# Patient Record
Sex: Male | Born: 2005 | Race: White | Hispanic: No | Marital: Single | Smoking: Never smoker
Health system: Southern US, Community
[De-identification: ages and names within clinical notes are randomized; demographics above are authoritative.]

## PROBLEM LIST (undated history)

## (undated) DIAGNOSIS — F900 Attention-deficit hyperactivity disorder, predominantly inattentive type: Secondary | ICD-10-CM

## (undated) DIAGNOSIS — Z8619 Personal history of other infectious and parasitic diseases: Secondary | ICD-10-CM

## (undated) DIAGNOSIS — F411 Generalized anxiety disorder: Secondary | ICD-10-CM

## (undated) HISTORY — DX: Personal history of other infectious and parasitic diseases: Z86.19

## (undated) HISTORY — DX: Generalized anxiety disorder: F41.1

## (undated) HISTORY — PX: NO PAST SURGERIES: SHX2092

## (undated) HISTORY — DX: Attention-deficit hyperactivity disorder, predominantly inattentive type: F90.0

---
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
## 2023-02-07 ENCOUNTER — Other Ambulatory Visit: Payer: Self-pay | Admitting: Pediatrics

## 2023-02-07 DIAGNOSIS — R1030 Lower abdominal pain, unspecified: Secondary | ICD-10-CM

## 2023-02-15 ENCOUNTER — Ambulatory Visit: Admission: RE | Admit: 2023-02-15 | Payer: 59 | Source: Ambulatory Visit

## 2023-04-06 ENCOUNTER — Encounter: Payer: Self-pay | Admitting: Urology

## 2023-04-06 ENCOUNTER — Ambulatory Visit (INDEPENDENT_AMBULATORY_CARE_PROVIDER_SITE_OTHER): Payer: 59 | Admitting: Urology

## 2023-04-06 VITALS — BP 145/95 | HR 125 | Ht 73.0 in | Wt 262.0 lb

## 2023-04-06 DIAGNOSIS — N5082 Scrotal pain: Secondary | ICD-10-CM

## 2023-04-06 DIAGNOSIS — M545 Low back pain, unspecified: Secondary | ICD-10-CM

## 2023-04-06 DIAGNOSIS — Z87438 Personal history of other diseases of male genital organs: Secondary | ICD-10-CM | POA: Diagnosis not present

## 2023-04-06 LAB — URINALYSIS, COMPLETE
Bilirubin, UA: NEGATIVE
Glucose, UA: NEGATIVE
Ketones, UA: NEGATIVE
Leukocytes,UA: NEGATIVE
Nitrite, UA: NEGATIVE
Protein,UA: NEGATIVE
RBC, UA: NEGATIVE
Specific Gravity, UA: 1.02 (ref 1.005–1.030)
Urobilinogen, Ur: 0.2 mg/dL (ref 0.2–1.0)
pH, UA: 6 (ref 5.0–7.5)

## 2023-04-06 LAB — MICROSCOPIC EXAMINATION

## 2023-04-06 NOTE — Progress Notes (Signed)
Marcelle Overlie Plume,acting as a scribe for Riki Altes, MD.,have documented all relevant documentation on the behalf of Riki Altes, MD,as directed by  Riki Altes, MD while in the presence of Riki Altes, MD.  04/06/2023 2:08 PM   Wayne Campbell 04-03-2006 [id number]  Referring provider: Jackelyn Poling, MD [address] [place],  [place] [address]  Chief Complaint  Patient presents with   New Patient (Initial Visit)    HPI: Wayne Campbell is a 17 y.o. male who is referred for evaluation of  right scrotal pain.  Seen at [facility] on 02/04/2023 with a 1 week history of dull right scrotal pain rated at 5/10. Symptoms exacerbated with walking He was treated empirically with doxycycline for epididymitis A scrotal ultrasound was ordered but not scheduled. He states his pain resolved after antibiotic therapy and did not get the ultrasound performed. His primary complaint has been lower back pain No bothersome LUTS He kept his appointment since he has been treated for epididymitis x2 over the last 2 years.  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 Homero Fellers psychologist)   GAD (generalized anxiety disorder)    History of shingles    due to varicella vaccine    Surgical History: Past Surgical History:  Procedure Laterality Date   NO PAST SURGERIES      Home Medications:  Allergies as of 04/06/2023   No Known Allergies      Medication List        Accurate as of April 06, 2023  2:08 PM. If you have any questions, ask your nurse or doctor.          STOP taking these medications    ondansetron 4 MG disintegrating tablet Commonly known as: Zofran ODT Stopped by: Riki Altes, MD       Family History: Family History  Problem Relation Age of Onset   Diabetes Paternal Uncle        type 1   Diabetes Paternal Grandfather        type 1    Arthritis Mother        OA, RA   Aneurysm Paternal Uncle    CAD Neg Hx    Stroke Neg Hx    Cancer Neg Hx     Social History:  reports that he has never smoked. He has never used smokeless tobacco. He reports that he does not drink alcohol and does not use drugs.   Physical Exam: BP (!) 145/95   Pulse (!) 125   Ht 6\' 1"  (1.854 m)   Wt 262 lb (118.8 kg)   BMI 34.57 kg/m   Constitutional:  Alert and oriented, No acute distress. HEENT: [place] AT, moist mucus membranes.  Trachea midline, no masses. Cardiovascular: No clubbing, cyanosis, or edema. Respiratory: Normal respiratory effort, no increased work of breathing. GI: Abdomen is soft, nontender, nondistended, no abdominal masses GU: Phallus without lesions. Testes descended bilaterally without masses or tenderness, spermatic cords/epididymis palpably normal bilaterally Skin: No rashes, bruises or suspicious lesions. Neurologic: Grossly intact, no focal deficits, moving all 4 extremities. Psychiatric: Normal mood and affect.  Laboratory Data:  Urinalysis Dipstick/ microscopy: Negative  Assessment & Plan:    1. History of scrotal pain Presently asymptomatic He was inquiring about possible reasons for recurrent epididymitis We discussed the diagnosis scrotal is often diagnosed as epididymitis and since he is presently asymptomatic  recommend a follow up visit for re-exam while symptomatic  2. Low back pain Most likely musculoskeletal in etiology and recommended PCP follow up for further evaluation  I have reviewed the above documentation for accuracy and completeness, and I agree with the above.   Riki Altes, MD  [facility] [address] [place], [place] [address] [phone]

## 2024-05-22 ENCOUNTER — Ambulatory Visit (INDEPENDENT_AMBULATORY_CARE_PROVIDER_SITE_OTHER): Admitting: Nurse Practitioner

## 2024-05-22 ENCOUNTER — Encounter: Payer: Self-pay | Admitting: Nurse Practitioner

## 2024-05-22 VITALS — BP 128/88 | HR 105 | Temp 98.2°F | Ht 74.85 in | Wt 276.4 lb

## 2024-05-22 DIAGNOSIS — Z13220 Encounter for screening for lipoid disorders: Secondary | ICD-10-CM

## 2024-05-22 DIAGNOSIS — Z114 Encounter for screening for human immunodeficiency virus [HIV]: Secondary | ICD-10-CM | POA: Diagnosis not present

## 2024-05-22 DIAGNOSIS — Z1159 Encounter for screening for other viral diseases: Secondary | ICD-10-CM | POA: Diagnosis not present

## 2024-05-22 DIAGNOSIS — Z6834 Body mass index (BMI) 34.0-34.9, adult: Secondary | ICD-10-CM | POA: Diagnosis not present

## 2024-05-22 DIAGNOSIS — F900 Attention-deficit hyperactivity disorder, predominantly inattentive type: Secondary | ICD-10-CM | POA: Diagnosis not present

## 2024-05-22 DIAGNOSIS — Z0000 Encounter for general adult medical examination without abnormal findings: Secondary | ICD-10-CM

## 2024-05-22 DIAGNOSIS — E669 Obesity, unspecified: Secondary | ICD-10-CM | POA: Diagnosis not present

## 2024-05-22 DIAGNOSIS — F320 Major depressive disorder, single episode, mild: Secondary | ICD-10-CM

## 2024-05-22 DIAGNOSIS — Z131 Encounter for screening for diabetes mellitus: Secondary | ICD-10-CM

## 2024-05-22 LAB — CBC WITH DIFFERENTIAL/PLATELET
Basophils Absolute: 0 K/uL (ref 0.0–0.1)
Basophils Relative: 0.6 % (ref 0.0–3.0)
Eosinophils Absolute: 0.2 K/uL (ref 0.0–0.7)
Eosinophils Relative: 2.9 % (ref 0.0–5.0)
HCT: 49.7 % (ref 39.0–52.0)
Hemoglobin: 16.8 g/dL (ref 13.0–17.0)
Lymphocytes Relative: 38.9 % (ref 12.0–46.0)
Lymphs Abs: 2.7 K/uL (ref 0.7–4.0)
MCHC: 33.7 g/dL (ref 30.0–36.0)
MCV: 89.1 fl (ref 78.0–100.0)
Monocytes Absolute: 0.7 K/uL (ref 0.1–1.0)
Monocytes Relative: 10.7 % (ref 3.0–12.0)
Neutro Abs: 3.3 K/uL (ref 1.4–7.7)
Neutrophils Relative %: 46.9 % (ref 43.0–77.0)
Platelets: 392 K/uL (ref 150.0–400.0)
RBC: 5.58 Mil/uL (ref 4.22–5.81)
RDW: 12.7 % (ref 11.5–14.6)
WBC: 6.9 K/uL (ref 4.5–10.5)

## 2024-05-22 LAB — COMPREHENSIVE METABOLIC PANEL WITH GFR
ALT: 72 U/L — ABNORMAL HIGH (ref 0–53)
AST: 29 U/L (ref 0–37)
Albumin: 4.9 g/dL (ref 3.5–5.2)
Alkaline Phosphatase: 76 U/L (ref 39–117)
BUN: 9 mg/dL (ref 6–23)
CO2: 29 meq/L (ref 19–32)
Calcium: 10 mg/dL (ref 8.4–10.5)
Chloride: 105 meq/L (ref 96–112)
Creatinine, Ser: 1.25 mg/dL (ref 0.40–1.50)
GFR: 82.89 mL/min (ref >60.00)
Glucose, Bld: 97 mg/dL (ref 70–99)
Potassium: 4.1 meq/L (ref 3.5–5.1)
Sodium: 142 meq/L (ref 135–145)
Total Bilirubin: 0.7 mg/dL (ref 0.2–1.2)
Total Protein: 7.6 g/dL (ref 6.0–8.3)

## 2024-05-22 LAB — LIPID PANEL
Cholesterol: 165 mg/dL (ref 0–200)
HDL: 37.5 mg/dL — ABNORMAL LOW (ref >39.00)
LDL Cholesterol: 97 mg/dL (ref 0–99)
NonHDL: 127.22
Total CHOL/HDL Ratio: 4
Triglycerides: 153 mg/dL — ABNORMAL HIGH (ref 0.0–149.0)
VLDL: 30.6 mg/dL (ref 0.0–40.0)

## 2024-05-22 LAB — TSH: TSH: 1.03 u[IU]/mL (ref 0.35–5.50)

## 2024-05-22 LAB — HEMOGLOBIN A1C: Hgb A1c MFr Bld: 5.4 % (ref 4.6–6.5)

## 2024-05-22 MED ORDER — SERTRALINE HCL 50 MG PO TABS: ORAL_TABLET | ORAL | 0 refills | Status: AC

## 2024-05-22 NOTE — Assessment & Plan Note (Signed)
 History of the same.  Never been on medication management.  Stable

## 2024-05-22 NOTE — Assessment & Plan Note (Signed)
 Pending TSH, A1c, lipid panel.  Work on healthy lifestyle modifications

## 2024-05-22 NOTE — Patient Instructions (Signed)
Nice to see you today I will be in touch with the labs once I have them Follow up with me in 6 weeks, sooner if you need me 

## 2024-05-22 NOTE — Assessment & Plan Note (Signed)
 No HI/SI/AVH.  Will start patient on sertraline  25 mg daily for 2 weeks then increase to 50 mg daily thereafter.  PHQ-9 and GAD-7 administered in office

## 2024-05-22 NOTE — Assessment & Plan Note (Signed)
 Discussed age-appropriate immunizations and screening exams.  Did review patient's personal, surgical, social, family histories.  Patient is up-to-date with all age-appropriate vaccinations he would like.  Patient declined COVID and HPV vaccines.  Patient is too young for CRC screening or prostate cancer screening.  Patient declined STI screen today.  Patient was given information at discharge about preventative healthcare maintenance with anticipatory guidance.

## 2024-05-22 NOTE — Progress Notes (Signed)
 New Patient Office Visit  Subjective    Patient ID: Wayne Campbell, male    DOB: Sep 25, 2006  Age: 18 y.o. MRN: [id number]  CC:  Chief Complaint  Patient presents with   Establish Care    General check up.     HPI Wayne Campbell presents to establish care   for complete physical and follow up of chronic conditions.  Seizures: states that in the 6th grade he came down with a fever. Thinks it was febrile seizure. States that he did hit his head against the tub.has been tested for epilipsy that was negative   ADD: hx dx is not currently medicated and has not been   GAD: states that he is not currenlty treated and feels ok. Denies any symptoms of anxiety.  Has not done therapy in the past. Immunizations: -Tetanus: Completed in 2017 -Influenza: out of season -Shingles: too young  -Pneumonia: too young  -covid: refused  -HPV: refused   Diet: Fair diet. He is eating 2 meals a day. He will skip breakfast. States that he does not snack often.  Soda  Exercise: No regular exercise.  Eye exam: PRN Dental exam: needs updating    Colonoscopy: Too young, currently average risk Lung Cancer Screening: N/A  PSA: Too young, currently average risk  Sleep: goes to bed around 10 and go sleep around 1 and then will get up around 10. Feels drowsy. Does not snore   STI: not currently sexually active     Outpatient Encounter Medications as of 05/22/2024  Medication Sig   sertraline  (ZOLOFT ) 50 MG tablet Take 0.5 tablets (25 mg total) by mouth daily for 14 days, THEN 1 tablet (50 mg total) daily for 16 days.   No facility-administered encounter medications on file as of 05/22/2024.    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 Elodia psychologist)   GAD (generalized anxiety disorder)    History of shingles    due to varicella vaccine    Past Surgical History:  Procedure Laterality Date   NO  PAST SURGERIES      Family History  Problem Relation Age of Onset   Diabetes Paternal Uncle        type 1   Diabetes Paternal Grandfather        type 1   Arthritis Mother        OA, RA   Aneurysm Paternal Uncle    CAD Neg Hx    Stroke Neg Hx    Cancer Neg Hx     Social History   Socioeconomic History   Marital status: Single    Spouse name: Not on file   Number of children: Not on file   Years of education: Not on file   Highest education level: Not on file  Occupational History   Not on file  Tobacco Use   Smoking status: Never   Smokeless tobacco: Never  Vaping Use   Vaping status: Former  Substance and Sexual Activity   Alcohol use: No   Drug use: No   Sexual activity: Not on file  Other Topics Concern   Not on file  Social History Narrative   He is debating on going back to school   He is not currently working       Live mom and dad. Has 2 brothers at home that are younger    Social Drivers of [employer]  Resource Strain: Not on file  Food Insecurity: Not on file  Transportation Needs: Not on file  Physical Activity: Not on file  Stress: Not on file  Social Connections: Not on file  Intimate Partner Violence: Not on file    Review of Systems  Constitutional:  Negative for chills and fever.  Respiratory:  Negative for shortness of breath.   Cardiovascular:  Negative for chest pain and leg swelling.  Gastrointestinal:  Negative for abdominal pain, blood in stool, constipation, diarrhea, nausea and vomiting.       BM daily to every other   Genitourinary:  Negative for dysuria and hematuria.  Neurological:  Negative for dizziness (quick position changes), tingling and headaches.  Psychiatric/Behavioral:  Negative for hallucinations and suicidal ideas.         Objective    BP 128/88   Pulse (!) 105   Temp 98.2 F (36.8 C) (Oral)   Ht 6' 2.85 (1.901 m)   Wt 276 lb 6.4 oz (125.4 kg)   SpO2 99%   BMI 34.69 kg/m   Physical  Exam Vitals and nursing note reviewed.  Constitutional:      Appearance: Normal appearance.  HENT:     Right Ear: Tympanic membrane, ear canal and external ear normal.     Left Ear: Tympanic membrane, ear canal and external ear normal.     Mouth/Throat:     Mouth: Mucous membranes are moist.     Pharynx: Oropharynx is clear.  Eyes:     Extraocular Movements: Extraocular movements intact.     Pupils: Pupils are equal, round, and reactive to light.  Cardiovascular:     Rate and Rhythm: Regular rhythm. Tachycardia present.     Pulses: Normal pulses.     Heart sounds: Normal heart sounds.  Pulmonary:     Effort: Pulmonary effort is normal.     Breath sounds: Normal breath sounds.  Abdominal:     General: Bowel sounds are normal. There is no distension.     Palpations: There is no mass.     Tenderness: There is no abdominal tenderness.     Hernia: No hernia is present.  Genitourinary:    Comments: deferred Musculoskeletal:     Right lower leg: No edema.     Left lower leg: No edema.  Lymphadenopathy:     Cervical: No cervical adenopathy.  Skin:    General: Skin is warm.  Neurological:     General: No focal deficit present.     Mental Status: He is alert.     Deep Tendon Reflexes:     Reflex Scores:      Bicep reflexes are 2+ on the right side and 2+ on the left side.      Patellar reflexes are 2+ on the right side and 2+ on the left side.    Comments: Bilateral upper and lower extremity strength 5/5  Psychiatric:        Mood and Affect: Mood normal.        Behavior: Behavior normal.        Thought Content: Thought content normal.        Judgment: Judgment normal.         Assessment & Plan:   Problem List Items Addressed This Visit       Other   Preventative health care - Primary   Discussed age-appropriate immunizations and screening exams.  Did review patient's personal, surgical, social, family histories.  Patient is up-to-date with all age-appropriate  vaccinations he would like.  Patient declined COVID and HPV vaccines.  Patient is too young for CRC screening or prostate cancer screening.  Patient declined STI screen today.  Patient was given information at discharge about preventative healthcare maintenance with anticipatory guidance.      Relevant Orders   CBC with Differential/Platelet   Comprehensive metabolic panel with GFR   Attention deficit hyperactivity disorder (ADHD), predominantly inattentive type   History of the same.  Never been on medication management.  Stable      Relevant Orders   TSH   Depression, major, single episode, mild (HCC)   No HI/SI/AVH.  Will start patient on sertraline  25 mg daily for 2 weeks then increase to 50 mg daily thereafter.  PHQ-9 and GAD-7 administered in office      Relevant Medications   sertraline  (ZOLOFT ) 50 MG tablet   Other Relevant Orders   TSH   Obesity (BMI 30-39.9)   Pending TSH, A1c, lipid panel.  Work on healthy lifestyle modifications      Relevant Orders   Hemoglobin A1c   Lipid panel   TSH   Other Visit Diagnoses       Encounter for hepatitis C screening test for low risk patient       Relevant Orders   Hepatitis C antibody     Encounter for screening for HIV       Relevant Orders   HIV antibody (with reflex)     Screening for lipid disorders       Relevant Orders   Lipid panel     Screening for diabetes mellitus       Relevant Orders   Hemoglobin A1c       Return in about 6 weeks (around 07/03/2024) for MDD .   Adina Crandall, NP

## 2024-05-23 LAB — HIV ANTIBODY (ROUTINE TESTING W REFLEX): HIV 1&2 Ab, 4th Generation: NONREACTIVE

## 2024-05-23 LAB — HEPATITIS C ANTIBODY: Hepatitis C Ab: NONREACTIVE

## 2024-05-24 ENCOUNTER — Ambulatory Visit: Payer: Self-pay | Admitting: Nurse Practitioner
